# Patient Record
Sex: Male | Born: 1960 | Race: White | Hispanic: No | Marital: Single | Smoking: Never smoker
Health system: Southern US, Community
[De-identification: ages and names within clinical notes are randomized; demographics above are authoritative.]

## PROBLEM LIST (undated history)

## (undated) DIAGNOSIS — I10 Essential (primary) hypertension: Secondary | ICD-10-CM

## (undated) DIAGNOSIS — R569 Unspecified convulsions: Secondary | ICD-10-CM

---

## 2017-08-04 ENCOUNTER — Emergency Department (HOSPITAL_COMMUNITY): Payer: BLUE CROSS/BLUE SHIELD

## 2017-08-04 ENCOUNTER — Encounter (HOSPITAL_COMMUNITY): Payer: Self-pay

## 2017-08-04 ENCOUNTER — Observation Stay (HOSPITAL_COMMUNITY)
Admission: EM | Admit: 2017-08-04 | Discharge: 2017-08-05 | Disposition: A | Discharge status: Home / Self Care | Payer: BLUE CROSS/BLUE SHIELD | Attending: General Surgery | Admitting: General Surgery

## 2017-08-04 DIAGNOSIS — Z23 Encounter for immunization: Secondary | ICD-10-CM | POA: Diagnosis not present

## 2017-08-04 DIAGNOSIS — S51012A Laceration without foreign body of left elbow, initial encounter: Secondary | ICD-10-CM | POA: Insufficient documentation

## 2017-08-04 DIAGNOSIS — Y92410 Unspecified street and highway as the place of occurrence of the external cause: Secondary | ICD-10-CM | POA: Diagnosis not present

## 2017-08-04 DIAGNOSIS — T1490XA Injury, unspecified, initial encounter: Secondary | ICD-10-CM

## 2017-08-04 DIAGNOSIS — S36039A Unspecified laceration of spleen, initial encounter: Secondary | ICD-10-CM | POA: Diagnosis present

## 2017-08-04 DIAGNOSIS — M47812 Spondylosis without myelopathy or radiculopathy, cervical region: Secondary | ICD-10-CM | POA: Insufficient documentation

## 2017-08-04 DIAGNOSIS — D735 Infarction of spleen: Principal | ICD-10-CM | POA: Insufficient documentation

## 2017-08-04 DIAGNOSIS — S0181XA Laceration without foreign body of other part of head, initial encounter: Secondary | ICD-10-CM | POA: Insufficient documentation

## 2017-08-04 DIAGNOSIS — I10 Essential (primary) hypertension: Secondary | ICD-10-CM | POA: Diagnosis not present

## 2017-08-04 DIAGNOSIS — Z87820 Personal history of traumatic brain injury: Secondary | ICD-10-CM | POA: Diagnosis not present

## 2017-08-04 DIAGNOSIS — S0240EA Zygomatic fracture, right side, initial encounter for closed fracture: Secondary | ICD-10-CM | POA: Diagnosis not present

## 2017-08-04 DIAGNOSIS — S299XXA Unspecified injury of thorax, initial encounter: Secondary | ICD-10-CM | POA: Insufficient documentation

## 2017-08-04 HISTORY — DX: Essential (primary) hypertension: I10

## 2017-08-04 LAB — COMPREHENSIVE METABOLIC PANEL
ALT: 32 U/L (ref 17–63)
ANION GAP: 12 (ref 5–15)
AST: 57 U/L — AB (ref 15–41)
Albumin: 4.1 g/dL (ref 3.5–5.0)
Alkaline Phosphatase: 52 U/L (ref 38–126)
BILIRUBIN TOTAL: 0.7 mg/dL (ref 0.3–1.2)
BUN: 13 mg/dL (ref 6–20)
CHLORIDE: 102 mmol/L (ref 101–111)
CO2: 24 mmol/L (ref 22–32)
Calcium: 8.5 mg/dL — ABNORMAL LOW (ref 8.9–10.3)
Creatinine, Ser: 1.07 mg/dL (ref 0.61–1.24)
Glucose, Bld: 117 mg/dL — ABNORMAL HIGH (ref 65–99)
POTASSIUM: 3 mmol/L — AB (ref 3.5–5.1)
Sodium: 138 mmol/L (ref 135–145)
TOTAL PROTEIN: 6.8 g/dL (ref 6.5–8.1)

## 2017-08-04 LAB — I-STAT CHEM 8, ED
BUN: 15 mg/dL (ref 6–20)
CALCIUM ION: 1.01 mmol/L — AB (ref 1.15–1.40)
CREATININE: 1.3 mg/dL — AB (ref 0.61–1.24)
Chloride: 99 mmol/L — ABNORMAL LOW (ref 101–111)
Glucose, Bld: 114 mg/dL — ABNORMAL HIGH (ref 65–99)
HEMATOCRIT: 44 % (ref 39.0–52.0)
HEMOGLOBIN: 15 g/dL (ref 13.0–17.0)
Potassium: 3 mmol/L — ABNORMAL LOW (ref 3.5–5.1)
Sodium: 140 mmol/L (ref 135–145)
TCO2: 25 mmol/L (ref 0–100)

## 2017-08-04 LAB — CBC
HEMATOCRIT: 42.5 % (ref 39.0–52.0)
HEMOGLOBIN: 14.5 g/dL (ref 13.0–17.0)
MCH: 32.4 pg (ref 26.0–34.0)
MCHC: 34.1 g/dL (ref 30.0–36.0)
MCV: 95.1 fL (ref 78.0–100.0)
Platelets: 178 10*3/uL (ref 150–400)
RBC: 4.47 MIL/uL (ref 4.22–5.81)
RDW: 13.3 % (ref 11.5–15.5)
WBC: 7.7 10*3/uL (ref 4.0–10.5)

## 2017-08-04 LAB — I-STAT CG4 LACTIC ACID, ED: LACTIC ACID, VENOUS: 3.78 mmol/L — AB (ref 0.5–1.9)

## 2017-08-04 LAB — ETHANOL: Alcohol, Ethyl (B): 317 mg/dL (ref <5)

## 2017-08-04 LAB — PROTIME-INR
INR: 0.9
PROTHROMBIN TIME: 12.1 s (ref 11.4–15.2)

## 2017-08-04 LAB — SAMPLE TO BLOOD BANK

## 2017-08-04 LAB — CDS SEROLOGY

## 2017-08-04 MED ORDER — SODIUM CHLORIDE 0.9 % IV BOLUS (SEPSIS)
1000.0000 mL | Freq: Once | INTRAVENOUS | Status: AC
  Administered 2017-08-04: 1000 mL via INTRAVENOUS

## 2017-08-04 MED ORDER — LIDOCAINE-EPINEPHRINE (PF) 2 %-1:200000 IJ SOLN
20.0000 mL | Freq: Once | INTRAMUSCULAR | Status: AC
  Administered 2017-08-04: 20 mL
  Filled 2017-08-04: qty 20

## 2017-08-04 MED ORDER — TETANUS-DIPHTH-ACELL PERTUSSIS 5-2.5-18.5 LF-MCG/0.5 IM SUSP
0.5000 mL | Freq: Once | INTRAMUSCULAR | Status: AC
  Administered 2017-08-04: 0.5 mL via INTRAMUSCULAR
  Filled 2017-08-04: qty 0.5

## 2017-08-04 MED ORDER — IOPAMIDOL (ISOVUE-300) INJECTION 61%
INTRAVENOUS | Status: AC
  Administered 2017-08-04: 100 mL
  Filled 2017-08-04: qty 100

## 2017-08-04 MED ORDER — LIDOCAINE HCL 2 % IJ SOLN: 20.0000 mL | Freq: Once | INTRAMUSCULAR | Status: DC

## 2017-08-04 MED ORDER — CEFAZOLIN SODIUM-DEXTROSE 1-4 GM/50ML-% IV SOLN
1.0000 g | Freq: Once | INTRAVENOUS | Status: AC
  Administered 2017-08-04: 1 g via INTRAVENOUS
  Filled 2017-08-04: qty 50

## 2017-08-04 MED ORDER — FENTANYL CITRATE (PF) 100 MCG/2ML IJ SOLN
50.0000 ug | Freq: Once | INTRAMUSCULAR | Status: AC
  Administered 2017-08-04: 50 ug via INTRAVENOUS
  Filled 2017-08-04: qty 2

## 2017-08-04 MED ORDER — MORPHINE SULFATE (PF) 4 MG/ML IV SOLN
4.0000 mg | INTRAVENOUS | Status: DC | PRN
  Administered 2017-08-05: 4 mg via INTRAVENOUS
  Filled 2017-08-04: qty 1
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
## 2017-08-04 NOTE — ED Notes (Signed)
Patient returned from CT and on monitor.

## 2017-08-04 NOTE — ED Notes (Signed)
Transported to CT via stretcher
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
## 2017-08-04 NOTE — ED Provider Notes (Signed)
LACERATION REPAIR Performed by: Chase Picket Jacques Willingham Consent: Verbal consent obtained. Risks and benefits: risks, benefits and alternatives were discussed Patient identity confirmed: provided demographic data Time out performed prior to procedure Prepped and Draped in normal sterile fashion Wound explored Laceration Location: Forehead Laceration Length: 10 cm No Foreign Bodies seen or palpated on wound exploration Anesthesia: local infiltration Local anesthetic: lidocaine 2%  with epinephrine Anesthetic total: 8 ml Irrigation method: syringe Amount of cleaning: thorough Skin closure: 5-0 Prolene Number of sutures or staples: 12 Technique: simple interrupted. Patient tolerance: Patient tolerated the procedure well with no immediate complications.   Darren Beard, Chase Picket, PA-C 08/04/17 2335    Charlynne Pander, MD 08/05/17 [phone]

## 2017-08-05 ENCOUNTER — Observation Stay (HOSPITAL_COMMUNITY): Payer: BLUE CROSS/BLUE SHIELD

## 2017-08-05 ENCOUNTER — Encounter (HOSPITAL_COMMUNITY): Payer: Self-pay | Admitting: Emergency Medicine

## 2017-08-05 DIAGNOSIS — S36039A Unspecified laceration of spleen, initial encounter: Secondary | ICD-10-CM | POA: Diagnosis present

## 2017-08-05 LAB — HIV ANTIBODY (ROUTINE TESTING W REFLEX): HIV Screen 4th Generation wRfx: NONREACTIVE

## 2017-08-05 LAB — URINALYSIS, ROUTINE W REFLEX MICROSCOPIC
BILIRUBIN URINE: NEGATIVE
Glucose, UA: NEGATIVE mg/dL
KETONES UR: NEGATIVE mg/dL
LEUKOCYTES UA: NEGATIVE
Nitrite: NEGATIVE
PH: 5 (ref 5.0–8.0)
PROTEIN: NEGATIVE mg/dL
SQUAMOUS EPITHELIAL / LPF: NONE SEEN
Specific Gravity, Urine: 1.032 — ABNORMAL HIGH (ref 1.005–1.030)

## 2017-08-05 LAB — BASIC METABOLIC PANEL
Anion gap: 15 (ref 5–15)
BUN: 10 mg/dL (ref 6–20)
CHLORIDE: 104 mmol/L (ref 101–111)
CO2: 18 mmol/L — ABNORMAL LOW (ref 22–32)
Calcium: 8.4 mg/dL — ABNORMAL LOW (ref 8.9–10.3)
Creatinine, Ser: 0.87 mg/dL (ref 0.61–1.24)
GFR calc Af Amer: >60 mL/min (ref >60)
GFR calc non Af Amer: >60 mL/min (ref >60)
GLUCOSE: 130 mg/dL — AB (ref 65–99)
POTASSIUM: 3.7 mmol/L (ref 3.5–5.1)
Sodium: 137 mmol/L (ref 135–145)

## 2017-08-05 LAB — CBC
HEMATOCRIT: 39.6 % (ref 39.0–52.0)
Hemoglobin: 13.5 g/dL (ref 13.0–17.0)
MCH: 32.4 pg (ref 26.0–34.0)
MCHC: 34.1 g/dL (ref 30.0–36.0)
MCV: 95 fL (ref 78.0–100.0)
Platelets: 165 10*3/uL (ref 150–400)
RBC: 4.17 MIL/uL — ABNORMAL LOW (ref 4.22–5.81)
RDW: 13.4 % (ref 11.5–15.5)
WBC: 12.2 10*3/uL — AB (ref 4.0–10.5)

## 2017-08-05 MED ORDER — PROCHLORPERAZINE EDISYLATE 5 MG/ML IJ SOLN: 5.0000 mg | Freq: Four times a day (QID) | INTRAMUSCULAR | Status: DC | PRN

## 2017-08-05 MED ORDER — SODIUM CHLORIDE 0.9 % IV SOLN
INTRAVENOUS | Status: DC
  Administered 2017-08-05: 03:00:00 via INTRAVENOUS

## 2017-08-05 MED ORDER — ONDANSETRON HCL 4 MG/2ML IJ SOLN: 4.0000 mg | Freq: Four times a day (QID) | INTRAMUSCULAR | Status: DC | PRN

## 2017-08-05 MED ORDER — ACETAMINOPHEN 325 MG PO TABS
650.0000 mg | ORAL_TABLET | ORAL | Status: DC | PRN
  Administered 2017-08-05: 650 mg via ORAL
  Filled 2017-08-05: qty 2

## 2017-08-05 MED ORDER — ONDANSETRON 4 MG PO TBDP: 4.0000 mg | ORAL_TABLET | Freq: Four times a day (QID) | ORAL | Status: DC | PRN

## 2017-08-05 MED ORDER — OXYCODONE HCL 5 MG PO TABS
5.0000 mg | ORAL_TABLET | ORAL | Status: DC | PRN
  Administered 2017-08-05 (×2): 5 mg via ORAL
  Filled 2017-08-05 (×2): qty 1

## 2017-08-05 MED ORDER — OXYCODONE HCL 5 MG PO TABS: 5.0000 mg | ORAL_TABLET | ORAL | 0 refills | Status: AC | PRN

## 2017-08-05 MED ORDER — PROCHLORPERAZINE MALEATE 10 MG PO TABS
10.0000 mg | ORAL_TABLET | Freq: Four times a day (QID) | ORAL | Status: DC | PRN
  Filled 2017-08-05: qty 1
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
## 2017-08-05 NOTE — H&P (Signed)
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 is oriented to person, place, and time. He appears well-developed and well-nourished.  HENT:  Head: Head is with laceration.    Eyes: Pupils are equal, round, and reactive to light. Conjunctivae and EOM are normal.  Neck: Normal range of motion. Neck supple.  Cardiovascular: Normal rate, regular rhythm and normal heart sounds.   Respiratory: Effort normal and breath sounds normal.  GI: Soft. Bowel sounds are normal.  Completely benign and nontender  Musculoskeletal: Normal range of motion.  Neurological: He is alert and oriented to person, place, and time. He has normal reflexes.  Smell of ETOH  Skin: Skin is warm.  Psychiatric: He has a normal mood and affect. His behavior is normal. Judgment and thought content normal.     Assessment/Plan CT demonstrates miniscule potential splenis and right paracolic hematomas.  Radiology call it Grade II, very likely only Grade I.  No symptoms and only an abrasion to the left abdominial wall.  Admit, IV fluidsl. Pain meds.  Ambulate, likely to go home tomorrow.  Trinitee Horgan 08/05/2017, 1:10 AM   Procedures
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
## 2017-08-05 NOTE — Progress Notes (Signed)
Orthopedic Tech Progress Note Patient Details:  Darren Beard Nov 07, 1961 [id number]  Ortho Devices Type of Ortho Device: Arm sling Ortho Device/Splint Location: lue Ortho Device/Splint Interventions: Application   Kalief Kattner 08/05/2017, 2:59 PM
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
## 2024-08-17 ENCOUNTER — Other Ambulatory Visit: Payer: Self-pay

## 2024-08-17 ENCOUNTER — Encounter (HOSPITAL_BASED_OUTPATIENT_CLINIC_OR_DEPARTMENT_OTHER): Payer: Self-pay | Admitting: Emergency Medicine

## 2024-08-17 ENCOUNTER — Emergency Department (HOSPITAL_BASED_OUTPATIENT_CLINIC_OR_DEPARTMENT_OTHER)
Admission: EM | Admit: 2024-08-17 | Discharge: 2024-08-18 | Disposition: A | Discharge status: Home / Self Care | Attending: Emergency Medicine | Admitting: Emergency Medicine

## 2024-08-17 DIAGNOSIS — R7309 Other abnormal glucose: Secondary | ICD-10-CM | POA: Insufficient documentation

## 2024-08-17 DIAGNOSIS — F10920 Alcohol use, unspecified with intoxication, uncomplicated: Secondary | ICD-10-CM

## 2024-08-17 DIAGNOSIS — S2231XA Fracture of one rib, right side, initial encounter for closed fracture: Secondary | ICD-10-CM | POA: Diagnosis not present

## 2024-08-17 DIAGNOSIS — R569 Unspecified convulsions: Secondary | ICD-10-CM | POA: Diagnosis not present

## 2024-08-17 DIAGNOSIS — S0081XA Abrasion of other part of head, initial encounter: Secondary | ICD-10-CM | POA: Insufficient documentation

## 2024-08-17 DIAGNOSIS — F10120 Alcohol abuse with intoxication, uncomplicated: Secondary | ICD-10-CM | POA: Insufficient documentation

## 2024-08-17 DIAGNOSIS — I10 Essential (primary) hypertension: Secondary | ICD-10-CM | POA: Diagnosis not present

## 2024-08-17 DIAGNOSIS — W228XXA Striking against or struck by other objects, initial encounter: Secondary | ICD-10-CM | POA: Insufficient documentation

## 2024-08-17 DIAGNOSIS — S29001A Unspecified injury of muscle and tendon of front wall of thorax, initial encounter: Secondary | ICD-10-CM | POA: Diagnosis present

## 2024-08-17 DIAGNOSIS — R739 Hyperglycemia, unspecified: Secondary | ICD-10-CM

## 2024-08-17 HISTORY — DX: Unspecified convulsions: R56.9

## 2024-08-17 LAB — CBG MONITORING, ED: Glucose-Capillary: 115 mg/dL — ABNORMAL HIGH (ref 70–99)

## 2024-08-17 NOTE — ED Triage Notes (Signed)
 Seizure about 1 hour prior to arrival. Pt states he hit his head (forehead- small abrasion).  He is on seizure meds- states he hasn't missed any medication.  Last ETOH today. Per pt 3 40oz a day.  Pt is supposed to take blood thinners but states they are too expensive. C/o R anterior rib pain. A&O, GCS 15.

## 2024-08-17 NOTE — ED Notes (Signed)
 Pt is High Fall Risk. Yellow armband placed on patient. Nonskid socks/patient shoes on. Fall Risk magnet placed on pt room door. Pt given call light and instructed to call for assistance before getting up. Pt/visitor verbalized understanding. Pt is High Fall Risk. Yellow armband placed on patient. Nonskid socks/patient shoes on. Fall Risk magnet placed on pt room door. Pt given call light and instructed to call for assistance before getting up. Pt/visitor verbalized understanding.

## 2024-08-18 ENCOUNTER — Emergency Department (HOSPITAL_BASED_OUTPATIENT_CLINIC_OR_DEPARTMENT_OTHER)

## 2024-08-18 ENCOUNTER — Encounter (HOSPITAL_BASED_OUTPATIENT_CLINIC_OR_DEPARTMENT_OTHER): Payer: Self-pay

## 2024-08-18 LAB — COMPREHENSIVE METABOLIC PANEL WITH GFR
ALT: 29 U/L (ref 0–44)
AST: 42 U/L — ABNORMAL HIGH (ref 15–41)
Albumin: 5.1 g/dL — ABNORMAL HIGH (ref 3.5–5.0)
Alkaline Phosphatase: 73 U/L (ref 38–126)
Anion gap: 12 (ref 5–15)
BUN: 8 mg/dL (ref 6–20)
CO2: 27 mmol/L (ref 22–32)
Calcium: 9.4 mg/dL (ref 8.9–10.3)
Chloride: 98 mmol/L (ref 98–111)
Creatinine, Ser: 0.89 mg/dL (ref 0.61–1.24)
GFR, Estimated: >60 mL/min (ref >60)
Glucose, Bld: 100 mg/dL — ABNORMAL HIGH (ref 70–99)
Potassium: 4.2 mmol/L (ref 3.5–5.1)
Sodium: 137 mmol/L (ref 135–145)
Total Bilirubin: 0.3 mg/dL (ref 0.0–1.2)
Total Protein: 8 g/dL (ref 6.5–8.1)

## 2024-08-18 LAB — ETHANOL: Alcohol, Ethyl (B): 144 mg/dL — ABNORMAL HIGH (ref <15)

## 2024-08-18 LAB — CBC WITH DIFFERENTIAL/PLATELET
Abs Immature Granulocytes: 0.03 K/uL (ref 0.00–0.07)
Basophils Absolute: 0.1 K/uL (ref 0.0–0.1)
Basophils Relative: 1 %
Eosinophils Absolute: 0.1 K/uL (ref 0.0–0.5)
Eosinophils Relative: 1 %
HCT: 45.8 % (ref 39.0–52.0)
Hemoglobin: 16.1 g/dL (ref 13.0–17.0)
Immature Granulocytes: 0 %
Lymphocytes Relative: 19 %
Lymphs Abs: 1.5 K/uL (ref 0.7–4.0)
MCH: 34.3 pg — ABNORMAL HIGH (ref 26.0–34.0)
MCHC: 35.2 g/dL (ref 30.0–36.0)
MCV: 97.7 fL (ref 80.0–100.0)
Monocytes Absolute: 0.7 K/uL (ref 0.1–1.0)
Monocytes Relative: 9 %
Neutro Abs: 5.6 K/uL (ref 1.7–7.7)
Neutrophils Relative %: 70 %
Platelets: 185 K/uL (ref 150–400)
RBC: 4.69 MIL/uL (ref 4.22–5.81)
RDW: 12.6 % (ref 11.5–15.5)
WBC: 7.9 K/uL (ref 4.0–10.5)
nRBC: 0 % (ref 0.0–0.2)

## 2024-08-18 MED ORDER — IOHEXOL 300 MG/ML  SOLN
75.0000 mL | Freq: Once | INTRAMUSCULAR | Status: AC | PRN
  Administered 2024-08-18: 75 mL via INTRAVENOUS

## 2024-08-18 MED ORDER — KETOROLAC TROMETHAMINE 30 MG/ML IJ SOLN
30.0000 mg | Freq: Once | INTRAMUSCULAR | Status: AC
  Administered 2024-08-18: 30 mg via INTRAVENOUS
  Filled 2024-08-18: qty 1

## 2024-08-18 MED ORDER — ACETAMINOPHEN 325 MG PO TABS
650.0000 mg | ORAL_TABLET | Freq: Once | ORAL | Status: AC
  Administered 2024-08-18: 650 mg via ORAL
  Filled 2024-08-18: qty 2

## 2024-08-18 NOTE — ED Provider Notes (Signed)
 [facility] Provider Note   CSN: [id number] Arrival date & time: 08/17/24  2336     Patient presents with: Seizures   Darren Beard is a 63 y.o. male.   The history is provided by the patient.  Seizures  He has history of hypertension, hyperlipidemia, DVT, pulmonary embolism supposed to be anticoagulated on apixaban but not taking medication because of expense, ethanol abuse, seizures and comes in because of a seizure tonight.  He states that he drinks 340 ounce beers a day and last drink was tonight.  He states he has been compliant with levetiracetam but has not been taking apixaban because of expense.  He denies drug use.  He did hit his head and is complaining of pain in the right side of his chest.  He denies bit lip or tongue and denies bowel or bladder incontinence but has had incontinence with other episodes of seizures.    Prior to Admission medications   Medication Sig Start Date End Date Taking? Authorizing Provider  oxyCODONE  (OXY IR/ROXICODONE ) 5 MG immediate release tablet Take 1 tablet (5 mg total) by mouth every 4 (four) hours as needed for moderate pain. 08/05/17   Rubin Calamity, MD    Allergies: Patient has no known allergies.    Review of Systems  Neurological:  Positive for seizures.  All other systems reviewed and are negative.   Updated Vital Signs BP 129/85   Pulse 79   Temp 98.1 F (36.7 C) (Oral)   Resp 20   Ht 5' 10 (1.778 m)   Wt 74.8 kg   SpO2 96%   BMI 23.68 kg/m   Physical Exam Vitals and nursing note reviewed.   63 year old male, resting comfortably and in no acute distress. Vital signs are normal. Oxygen saturation is 96%, which is normal. Head is normocephalic.  Abrasion is present over the forehead. PERRLA, EOMI. Oropharynx is clear. Neck is moderately tender diffusely. Back is nontender. Lungs are clear without rales, wheezes, or rhonchi. Chest has marked tenderness in the right  lateral chest wall.  There is no crepitus. Heart has regular rate and rhythm without murmur. Abdomen is soft, flat, nontender. Extremities have no cyanosis or edema, full range of motion is present. Skin is warm and dry without rash. Neurologic: Awake and alert but speech is slightly slurred suggesting alcohol intoxication.  Cranial nerves are intact, moves all extremities equally.    (all labs ordered are listed, but only abnormal results are displayed) Labs Reviewed  COMPREHENSIVE METABOLIC PANEL WITH GFR - Abnormal; Notable for the following components:      Result Value   Glucose, Bld 100 (*)    Albumin 5.1 (*)    AST 42 (*)    All other components within normal limits  ETHANOL - Abnormal; Notable for the following components:   Alcohol, Ethyl (B) 144 (*)    All other components within normal limits  CBC WITH DIFFERENTIAL/PLATELET - Abnormal; Notable for the following components:   MCH 34.3 (*)    All other components within normal limits  CBG MONITORING, ED - Abnormal; Notable for the following components:   Glucose-Capillary 115 (*)    All other components within normal limits    Radiology: CT Chest W Contrast Result Date: 08/18/2024 CLINICAL DATA:  Chest trauma. EXAM: CT CHEST WITH CONTRAST TECHNIQUE: Multidetector CT imaging of the chest was performed during intravenous contrast administration. RADIATION DOSE REDUCTION: This exam was performed according to the  departmental dose-optimization program which includes automated exposure control, adjustment of the mA and/or kV according to patient size and/or use of iterative reconstruction technique. CONTRAST:  75mL OMNIPAQUE  IOHEXOL  300 MG/ML  SOLN COMPARISON:  CT of the chest 08/04/2017. FINDINGS: Cardiovascular: No significant vascular findings. Normal heart size. No pericardial effusion. Mediastinum/Nodes: No enlarged mediastinal, hilar, or axillary lymph nodes. Thyroid gland, trachea, and esophagus demonstrate no significant  findings. Lungs/Pleura: Lungs are clear. No layering pleural effusion or pneumothorax. Upper Abdomen: No acute abnormality. Musculoskeletal: There is a posterolateral acute right sixth rib fracture, nondisplaced. No other acute fractures are seen. There are multiple healed posterior left rib fractures. Degenerative changes affect the spine. IMPRESSION: 1. Acute nondisplaced posterolateral right sixth rib fracture. 2. No other acute posttraumatic sequelae in the chest. Electronically Signed   By: Greig Pique M.D.   On: 08/18/2024 01:42   CT Head Wo Contrast Result Date: 08/18/2024 CLINICAL DATA:  Facial trauma, blunt; Neck trauma, dangerous injury mechanism (Age 48-64y) EXAM: CT HEAD WITHOUT CONTRAST CT CERVICAL SPINE WITHOUT CONTRAST TECHNIQUE: Multidetector CT imaging of the head and cervical spine was performed following the standard protocol without intravenous contrast. Multiplanar CT image reconstructions of the cervical spine were also generated. RADIATION DOSE REDUCTION: This exam was performed according to the departmental dose-optimization program which includes automated exposure control, adjustment of the mA and/or kV according to patient size and/or use of iterative reconstruction technique. COMPARISON:  None Available. FINDINGS: CT HEAD FINDINGS Brain: Normal anatomic configuration. Parenchymal volume loss is commensurate with the patient's age. Mild periventricular white matter changes are present likely reflecting the sequela of small vessel ischemia. No abnormal intra or extra-axial mass lesion or fluid collection. No abnormal mass effect or midline shift. No evidence of acute intracranial hemorrhage or infarct. Ventricular size is normal. Cerebellum unremarkable. Vascular: No asymmetric hyperdense vasculature at the skull base. Skull: Intact Sinuses/Orbits: Paranasal sinuses are clear. Orbits are unremarkable. Other: Mastoid air cells and middle ear cavities are clear. Mild right supraorbital  scalp soft tissue swelling CT CERVICAL SPINE FINDINGS Alignment: Normal. Skull base and vertebrae: Craniocervical alignment is normal. The atlantodental interval is not widened. No acute fracture of the cervical spine. Vertebral body height is preserved. There are bridging anterior disc osteophytes noted at C3-4 Soft tissues and spinal canal: No prevertebral fluid or swelling. No visible canal hematoma. Disc levels: There is mild disc space narrowing at C4-5 as well as prominent disc osteophytes noted at C3-4, C5-6 and C6-7 keeping with changes of moderate degenerative disc disease. Asymmetric severe left facet arthrosis at C4-5 results in severe left neuroforaminal narrowing at this level. Remaining neural foramina are widely patent Upper chest: Negative. Other: None IMPRESSION: 1. No acute intracranial abnormality. No calvarial fracture. Mild right supraorbital scalp soft tissue swelling. 2. No acute fracture or listhesis of the cervical spine. 3. 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 Electronically Signed   By: Dorethia Molt M.D.   On: 08/18/2024 01:36   CT Cervical Spine Wo Contrast Result Date: 08/18/2024 CLINICAL DATA:  Facial trauma, blunt; Neck trauma, dangerous injury mechanism (Age 29-64y) EXAM: CT HEAD WITHOUT CONTRAST CT CERVICAL SPINE WITHOUT CONTRAST TECHNIQUE: Multidetector CT imaging of the head and cervical spine was performed following the standard protocol without intravenous contrast. Multiplanar CT image reconstructions of the cervical spine were also generated. RADIATION DOSE REDUCTION: This exam was performed according to the departmental dose-optimization program which includes automated exposure control, adjustment of the mA  and/or kV according to patient size and/or use of iterative reconstruction technique. COMPARISON:  None Available. FINDINGS: CT HEAD FINDINGS Brain: Normal anatomic configuration. Parenchymal  volume loss is commensurate with the patient's age. Mild periventricular white matter changes are present likely reflecting the sequela of small vessel ischemia. No abnormal intra or extra-axial mass lesion or fluid collection. No abnormal mass effect or midline shift. No evidence of acute intracranial hemorrhage or infarct. Ventricular size is normal. Cerebellum unremarkable. Vascular: No asymmetric hyperdense vasculature at the skull base. Skull: Intact Sinuses/Orbits: Paranasal sinuses are clear. Orbits are unremarkable. Other: Mastoid air cells and middle ear cavities are clear. Mild right supraorbital scalp soft tissue swelling CT CERVICAL SPINE FINDINGS Alignment: Normal. Skull base and vertebrae: Craniocervical alignment is normal. The atlantodental interval is not widened. No acute fracture of the cervical spine. Vertebral body height is preserved. There are bridging anterior disc osteophytes noted at C3-4 Soft tissues and spinal canal: No prevertebral fluid or swelling. No visible canal hematoma. Disc levels: There is mild disc space narrowing at C4-5 as well as prominent disc osteophytes noted at C3-4, C5-6 and C6-7 keeping with changes of moderate degenerative disc disease. Asymmetric severe left facet arthrosis at C4-5 results in severe left neuroforaminal narrowing at this level. Remaining neural foramina are widely patent Upper chest: Negative. Other: None IMPRESSION: 1. No acute intracranial abnormality. No calvarial fracture. Mild right supraorbital scalp soft tissue swelling. 2. No acute fracture or listhesis of the cervical spine. 3. 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 Electronically Signed   By: Dorethia Molt M.D.   On: 08/18/2024 01:36     Procedures   Medications Ordered in the ED  ketorolac  (TORADOL ) 30 MG/ML injection 30 mg (has no administration in time range)  iohexol  (OMNIPAQUE ) 300 MG/ML solution 75 mL (75 mLs  Intravenous Contrast Given 08/18/24 0125)  acetaminophen  (TYLENOL ) tablet 650 mg (650 mg Oral Given 08/18/24 0208)                                    Medical Decision Making Amount and/or Complexity of Data Reviewed Labs: ordered. Radiology: ordered.  Risk OTC drugs. Prescription drug management.   Seizure and patient who states that he is compliant with anticonvulsants, but history of ethanol abuse.  I have reviewed his past records, and note hospitalization on 08/08/2022 for alcohol withdrawal seizure.  Tdap had been given on 08/04/2017, no need for tetanus immunization today.  With evidence of head injury I am worried about possible intracranial injury.  With chest tenderness, I am worried about possible rib fracture.  Also, with neck tenderness I am worried about occult C-spine fracture.  I have ordered CT scans of head, cervical spine, chest and I have ordered screening labs.  CT scans of head and cervical spine showed no acute injury, CT of chest shows fracture of the right sixth rib.  I have independently viewed all the images, and agree with the radiologist's interpretation.  I have reviewed his laboratory tests, and my interpretation is borderline elevated AST which is not felt to be clinically significant, elevated ethanol level in the range of legal intoxication, normal CBC.  I have ordered a dose of ketorolac  and acetaminophen  for pain.  I am encouraging the patient to stop drinking, however I doubt he will follow through with this recommendation.  I am referring him to neurology for  follow-up of his seizure disorder.  I have recommended he use acetaminophen  and ibuprofen as needed for pain.  I have advised him that I am not comfortable prescribing narcotics for him in the setting of severe ethanol abuse.     Final diagnoses:  Seizure (HCC)  Closed fracture of one rib of right side, initial encounter  Abrasion of forehead, initial encounter  Alcohol intoxication, uncomplicated  (HCC)  Elevated random blood glucose level    ED Discharge Orders     None          Raford Lenis, MD 08/18/24 0211

## 2024-08-18 NOTE — Discharge Instructions (Signed)
 You have a broken rib which is causing your pain.  I recommend that you apply ice.  Ice should be applied for 30 minutes at a time, 4 times a day.  You may take acetaminophen  and/or ibuprofen as needed for pain.  If you combine acetaminophen  and ibuprofen, you will get better pain relief and you get from taking either medication by itself.  It will take several months for this broken rib to heal.  Your drinking makes treatment of pain and seizures more difficult.  I strongly recommend that you stop drinking.  I am providing resources for outpatient programs that can help you with stopping drinking.  I strongly recommend you follow-up with a neurologist to assist with management of your seizures.

## 2024-08-18 NOTE — ED Notes (Signed)
 AVS provided by edp was reviewed with the pt. Pt verbalized understanding with no additional questions at this time. Pt wheelchair to car. Is going home with friend/neighbor Darren Beard.

## 2024-08-18 NOTE — ED Notes (Signed)
 Pt was unable to urinate while in bed. Was able to stand at bedside and use urinal. Pt readjusted into bed. Seizure pads in place. Updated in POC
